# Patient Record
Sex: Male | Born: 1995 | Race: White | Hispanic: No | Marital: Single | Smoking: Current every day smoker
Health system: Southern US, Community
[De-identification: ages and names within clinical notes are randomized; demographics above are authoritative.]

## PROBLEM LIST (undated history)

## (undated) DIAGNOSIS — F1410 Cocaine abuse, uncomplicated: Secondary | ICD-10-CM

## (undated) DIAGNOSIS — F1910 Other psychoactive substance abuse, uncomplicated: Secondary | ICD-10-CM

## (undated) DIAGNOSIS — F1010 Alcohol abuse, uncomplicated: Secondary | ICD-10-CM

## (undated) DIAGNOSIS — Z720 Tobacco use: Secondary | ICD-10-CM

## (undated) DIAGNOSIS — L309 Dermatitis, unspecified: Secondary | ICD-10-CM

---

## 2015-05-12 ENCOUNTER — Observation Stay (HOSPITAL_COMMUNITY)
Admission: EM | Admit: 2015-05-12 | Discharge: 2015-05-13 | Disposition: A | Discharge status: Home / Self Care | Attending: Internal Medicine | Admitting: Internal Medicine

## 2015-05-12 ENCOUNTER — Encounter (HOSPITAL_COMMUNITY): Payer: Self-pay | Admitting: Emergency Medicine

## 2015-05-12 DIAGNOSIS — R569 Unspecified convulsions: Principal | ICD-10-CM | POA: Insufficient documentation

## 2015-05-12 DIAGNOSIS — S020XXA Fracture of vault of skull, initial encounter for closed fracture: Secondary | ICD-10-CM

## 2015-05-12 DIAGNOSIS — N179 Acute kidney failure, unspecified: Secondary | ICD-10-CM | POA: Insufficient documentation

## 2015-05-12 DIAGNOSIS — F1510 Other stimulant abuse, uncomplicated: Secondary | ICD-10-CM | POA: Diagnosis not present

## 2015-05-12 DIAGNOSIS — S0003XA Contusion of scalp, initial encounter: Secondary | ICD-10-CM | POA: Insufficient documentation

## 2015-05-12 DIAGNOSIS — F1210 Cannabis abuse, uncomplicated: Secondary | ICD-10-CM | POA: Insufficient documentation

## 2015-05-12 DIAGNOSIS — E860 Dehydration: Secondary | ICD-10-CM | POA: Insufficient documentation

## 2015-05-12 DIAGNOSIS — T50902A Poisoning by unspecified drugs, medicaments and biological substances, intentional self-harm, initial encounter: Secondary | ICD-10-CM

## 2015-05-12 DIAGNOSIS — F1010 Alcohol abuse, uncomplicated: Secondary | ICD-10-CM | POA: Diagnosis not present

## 2015-05-12 DIAGNOSIS — F17200 Nicotine dependence, unspecified, uncomplicated: Secondary | ICD-10-CM | POA: Diagnosis not present

## 2015-05-12 DIAGNOSIS — R32 Unspecified urinary incontinence: Secondary | ICD-10-CM | POA: Diagnosis not present

## 2015-05-12 DIAGNOSIS — R000 Tachycardia, unspecified: Secondary | ICD-10-CM | POA: Insufficient documentation

## 2015-05-12 DIAGNOSIS — W19XXXA Unspecified fall, initial encounter: Secondary | ICD-10-CM | POA: Diagnosis not present

## 2015-05-12 DIAGNOSIS — Z720 Tobacco use: Secondary | ICD-10-CM | POA: Diagnosis present

## 2015-05-12 DIAGNOSIS — F1910 Other psychoactive substance abuse, uncomplicated: Secondary | ICD-10-CM | POA: Diagnosis present

## 2015-05-12 DIAGNOSIS — F14129 Cocaine abuse with intoxication, unspecified: Secondary | ICD-10-CM | POA: Insufficient documentation

## 2015-05-12 DIAGNOSIS — F1410 Cocaine abuse, uncomplicated: Secondary | ICD-10-CM

## 2015-05-12 HISTORY — DX: Cocaine abuse, uncomplicated: F14.10

## 2015-05-12 HISTORY — DX: Other psychoactive substance abuse, uncomplicated: F19.10

## 2015-05-12 HISTORY — DX: Tobacco use: Z72.0

## 2015-05-12 HISTORY — DX: Dermatitis, unspecified: L30.9

## 2015-05-12 HISTORY — DX: Alcohol abuse, uncomplicated: F10.10

## 2015-05-12 LAB — CBC
HCT: 42.6 % (ref 39.0–52.0)
Hemoglobin: 14.3 g/dL (ref 13.0–17.0)
MCH: 26.7 pg (ref 26.0–34.0)
MCHC: 33.6 g/dL (ref 30.0–36.0)
MCV: 79.6 fL (ref 78.0–100.0)
PLATELETS: 440 10*3/uL — AB (ref 150–400)
RBC: 5.35 MIL/uL (ref 4.22–5.81)
RDW: 14.4 % (ref 11.5–15.5)
WBC: 16.5 10*3/uL — ABNORMAL HIGH (ref 4.0–10.5)

## 2015-05-12 MED ORDER — LORAZEPAM 1 MG PO TABS
2.0000 mg | ORAL_TABLET | Freq: Once | ORAL | Status: AC
  Administered 2015-05-12: 2 mg via ORAL
  Filled 2015-05-12: qty 2

## 2015-05-12 MED ORDER — SODIUM CHLORIDE 0.9 % IV BOLUS (SEPSIS)
1000.0000 mL | Freq: Once | INTRAVENOUS | Status: AC
  Administered 2015-05-12: 1000 mL via INTRAVENOUS

## 2015-05-12 NOTE — ED Notes (Signed)
[facility] reported witnessed (by Technical sales engineerofficer) seizure with fall, 2-3 mins in duration, post-ictal on EMS arrival, urinary incontinence noted, no oral trauma, hematoma to forehead - no other trauma, ST 125, BP stable  CBG 131

## 2015-05-13 ENCOUNTER — Emergency Department (HOSPITAL_COMMUNITY)

## 2015-05-13 ENCOUNTER — Observation Stay (HOSPITAL_BASED_OUTPATIENT_CLINIC_OR_DEPARTMENT_OTHER)
Admit: 2015-05-13 | Discharge: 2015-05-13 | Disposition: A | Discharge status: Home / Self Care | Attending: Internal Medicine | Admitting: Internal Medicine

## 2015-05-13 ENCOUNTER — Encounter (HOSPITAL_COMMUNITY): Payer: Self-pay | Admitting: Internal Medicine

## 2015-05-13 DIAGNOSIS — F1410 Cocaine abuse, uncomplicated: Secondary | ICD-10-CM | POA: Diagnosis not present

## 2015-05-13 DIAGNOSIS — Z720 Tobacco use: Secondary | ICD-10-CM | POA: Diagnosis present

## 2015-05-13 DIAGNOSIS — S020XXA Fracture of vault of skull, initial encounter for closed fracture: Secondary | ICD-10-CM | POA: Diagnosis present

## 2015-05-13 DIAGNOSIS — F1910 Other psychoactive substance abuse, uncomplicated: Secondary | ICD-10-CM | POA: Diagnosis present

## 2015-05-13 DIAGNOSIS — R569 Unspecified convulsions: Secondary | ICD-10-CM

## 2015-05-13 DIAGNOSIS — F1010 Alcohol abuse, uncomplicated: Secondary | ICD-10-CM | POA: Diagnosis present

## 2015-05-13 DIAGNOSIS — N179 Acute kidney failure, unspecified: Secondary | ICD-10-CM | POA: Diagnosis present

## 2015-05-13 LAB — I-STAT CHEM 8, ED
BUN: 13 mg/dL (ref 6–20)
CHLORIDE: 108 mmol/L (ref 101–111)
Calcium, Ion: 0.93 mmol/L — ABNORMAL LOW (ref 1.12–1.23)
Creatinine, Ser: 1.3 mg/dL — ABNORMAL HIGH (ref 0.61–1.24)
GLUCOSE: 124 mg/dL — AB (ref 65–99)
HEMATOCRIT: 48 % (ref 39.0–52.0)
Hemoglobin: 16.3 g/dL (ref 13.0–17.0)
POTASSIUM: 5.1 mmol/L (ref 3.5–5.1)
SODIUM: 137 mmol/L (ref 135–145)
TCO2: 18 mmol/L (ref 0–100)

## 2015-05-13 LAB — BASIC METABOLIC PANEL
ANION GAP: 14 (ref 5–15)
Anion gap: 8 (ref 5–15)
BUN: 10 mg/dL (ref 6–20)
BUN: 6 mg/dL (ref 6–20)
CHLORIDE: 106 mmol/L (ref 101–111)
CHLORIDE: 107 mmol/L (ref 101–111)
CO2: 18 mmol/L — ABNORMAL LOW (ref 22–32)
CO2: 23 mmol/L (ref 22–32)
CREATININE: 1.13 mg/dL (ref 0.61–1.24)
Calcium: 9.4 mg/dL (ref 8.9–10.3)
Calcium: 8.4 mg/dL — ABNORMAL LOW (ref 8.9–10.3)
Creatinine, Ser: 1.42 mg/dL — ABNORMAL HIGH (ref 0.61–1.24)
GFR calc Af Amer: >60 mL/min (ref >60)
GFR calc non Af Amer: >60 mL/min (ref >60)
Glucose, Bld: 120 mg/dL — ABNORMAL HIGH (ref 65–99)
Glucose, Bld: 96 mg/dL (ref 65–99)
POTASSIUM: 3.9 mmol/L (ref 3.5–5.1)
Potassium: 3.2 mmol/L — ABNORMAL LOW (ref 3.5–5.1)
SODIUM: 138 mmol/L (ref 135–145)
Sodium: 138 mmol/L (ref 135–145)

## 2015-05-13 LAB — CBC
HCT: 37 % — ABNORMAL LOW (ref 39.0–52.0)
Hemoglobin: 12.1 g/dL — ABNORMAL LOW (ref 13.0–17.0)
MCH: 26.7 pg (ref 26.0–34.0)
MCHC: 32.7 g/dL (ref 30.0–36.0)
MCV: 81.5 fL (ref 78.0–100.0)
PLATELETS: 374 10*3/uL (ref 150–400)
RBC: 4.54 MIL/uL (ref 4.22–5.81)
RDW: 14.6 % (ref 11.5–15.5)
WBC: 14.9 10*3/uL — ABNORMAL HIGH (ref 4.0–10.5)

## 2015-05-13 LAB — GLUCOSE, CAPILLARY: Glucose-Capillary: 104 mg/dL — ABNORMAL HIGH (ref 65–99)

## 2015-05-13 LAB — RAPID URINE DRUG SCREEN, HOSP PERFORMED
AMPHETAMINES: NOT DETECTED
BENZODIAZEPINES: POSITIVE — AB
Barbiturates: NOT DETECTED
Cocaine: POSITIVE — AB
Opiates: NOT DETECTED
Tetrahydrocannabinol: POSITIVE — AB

## 2015-05-13 LAB — CK: CK TOTAL: 320 U/L (ref 49–397)

## 2015-05-13 LAB — SODIUM, URINE, RANDOM: Sodium, Ur: 166 mmol/L

## 2015-05-13 LAB — CREATININE, URINE, RANDOM: Creatinine, Urine: 131.63 mg/dL

## 2015-05-13 MED ORDER — ENOXAPARIN SODIUM 40 MG/0.4ML ~~LOC~~ SOLN: 40.0000 mg | Freq: Every day | SUBCUTANEOUS | Status: DC

## 2015-05-13 MED ORDER — ONDANSETRON HCL 4 MG PO TABS: 4.0000 mg | ORAL_TABLET | Freq: Four times a day (QID) | ORAL | Status: DC | PRN

## 2015-05-13 MED ORDER — POTASSIUM CHLORIDE 20 MEQ PO PACK
40.0000 meq | PACK | Freq: Once | ORAL | Status: AC
  Administered 2015-05-13: 40 meq via ORAL
  Filled 2015-05-13: qty 2

## 2015-05-13 MED ORDER — ADULT MULTIVITAMIN W/MINERALS CH
1.0000 | ORAL_TABLET | Freq: Every day | ORAL | Status: DC
  Administered 2015-05-13: 1 via ORAL
  Filled 2015-05-13: qty 1

## 2015-05-13 MED ORDER — LORAZEPAM 2 MG/ML IJ SOLN: 0.0000 mg | Freq: Two times a day (BID) | INTRAMUSCULAR | Status: DC

## 2015-05-13 MED ORDER — ONDANSETRON HCL 4 MG/2ML IJ SOLN: 4.0000 mg | Freq: Four times a day (QID) | INTRAMUSCULAR | Status: DC | PRN

## 2015-05-13 MED ORDER — ACETAMINOPHEN 650 MG RE SUPP: 650.0000 mg | Freq: Four times a day (QID) | RECTAL | Status: DC | PRN

## 2015-05-13 MED ORDER — VITAMIN B-1 100 MG PO TABS
100.0000 mg | ORAL_TABLET | Freq: Every day | ORAL | Status: DC
Start: 2015-05-13 — End: 2015-05-13
  Administered 2015-05-13: 100 mg via ORAL
  Filled 2015-05-13: qty 1

## 2015-05-13 MED ORDER — NICOTINE 21 MG/24HR TD PT24
21.0000 mg | MEDICATED_PATCH | Freq: Every day | TRANSDERMAL | Status: DC
Start: 2015-05-13 — End: 2015-05-13
  Administered 2015-05-13: 21 mg via TRANSDERMAL
  Filled 2015-05-13: qty 1

## 2015-05-13 MED ORDER — LORAZEPAM 2 MG/ML IJ SOLN: 0.0000 mg | Freq: Four times a day (QID) | INTRAMUSCULAR | Status: DC

## 2015-05-13 MED ORDER — ACETAMINOPHEN 325 MG PO TABS
650.0000 mg | ORAL_TABLET | Freq: Four times a day (QID) | ORAL | Status: DC | PRN
  Administered 2015-05-13: 650 mg via ORAL
  Filled 2015-05-13: qty 2

## 2015-05-13 MED ORDER — FOLIC ACID 1 MG PO TABS
1.0000 mg | ORAL_TABLET | Freq: Every day | ORAL | Status: DC
Start: 2015-05-13 — End: 2015-05-13
  Administered 2015-05-13: 1 mg via ORAL
  Filled 2015-05-13: qty 1

## 2015-05-13 MED ORDER — SODIUM CHLORIDE 0.9 % IV BOLUS (SEPSIS)
1000.0000 mL | Freq: Once | INTRAVENOUS | Status: AC
  Administered 2015-05-13: 1000 mL via INTRAVENOUS

## 2015-05-13 MED ORDER — THIAMINE HCL 100 MG/ML IJ SOLN
100.0000 mg | Freq: Every day | INTRAMUSCULAR | Status: DC
  Filled 2015-05-13: qty 2

## 2015-05-13 MED ORDER — LORAZEPAM 1 MG PO TABS: 1.0000 mg | ORAL_TABLET | Freq: Four times a day (QID) | ORAL | Status: DC | PRN

## 2015-05-13 MED ORDER — LORAZEPAM 1 MG PO TABS: 2.0000 mg | ORAL_TABLET | ORAL | Status: DC | PRN

## 2015-05-13 MED ORDER — LORAZEPAM 2 MG/ML IJ SOLN
2.0000 mg | Freq: Once | INTRAMUSCULAR | Status: AC
  Administered 2015-05-13: 2 mg via INTRAVENOUS
  Filled 2015-05-13: qty 1

## 2015-05-13 MED ORDER — SODIUM CHLORIDE 0.9 % IV SOLN
INTRAVENOUS | Status: DC
  Administered 2015-05-13: 03:00:00 via INTRAVENOUS

## 2015-05-13 MED ORDER — LORAZEPAM 2 MG/ML IJ SOLN: 1.0000 mg | Freq: Four times a day (QID) | INTRAMUSCULAR | Status: DC | PRN

## 2015-05-13 NOTE — Procedures (Signed)
HPI:  20 y/o presents with seizure  TECHNICAL SUMMARY:  A multichannel referential and bipolar montage EEG using the standard international 10-20 system was performed on the patient described as lethargic.  The dominant background activity consists of 9 hertz activity seen most prominantly over the posterior head region.  The backgound activity is reactive to eye opening and closing procedures.  Low voltage fast (beta) activity is distributed symmetrically and maximally over the anterior head regions.  ACTIVATION:  Stepwise photic stimulation at 4-20 flashes per second was performed and did not elicit any abnormal waveforms.  Hyperventilation was not performed.  EPILEPTIFORM ACTIVITY:  There were no spikes, sharp waves or paroxysmal activity.  SLEEP:  Stage 1 and 2 sleep noted.  CARDIAC:  The EKG lead revealed a regular sinus rhythm.  IMPRESSION:  This is a normal EEG for the patients stated age.  There were no focal, hemispheric or lateralizing features.  No epileptiform activity was recorded.  A normal EEG does not exclude the diagnosis of a seizure disorder and if seizure remains high on the list of differential diagnosis, an ambulatory EEG may be of value.  Clinical correlation is required.

## 2015-05-13 NOTE — ED Notes (Signed)
Dr Niu at bedside 

## 2015-05-13 NOTE — Progress Notes (Signed)
Pt requesting to leave hospital despite MD's recommendation to stay one more day. Pt verbalized understanding of MD's request but still requested to leave. Pt was attempting to remove IV before RN entered room and redressed IV temporarily. Pt stated IV still came out later while patient was bending over to put socks on.

## 2015-05-13 NOTE — H&P (Signed)
History and Physical    Jimmy May ZOX:[id number] DOB: 06-02-1995 DOA: 05/12/2015  Referring MD/NP/PA:   PCP: No PCP Per Patient   Outpatient Specialists: none  [place]  Chief Complaint: seizure and fall  HPI: Jimmy May is a 20 y.o. male with medical history significant of eczema, who presents with seizure and fall.  [facility] Per report, pt swallowed 8 bags of cocaine when police pulled over at about 1:00 PM. Then he had fall in jail at about 8 hours later and injured his right frontal head, then he had a witnessed seizure with body shaking. Th episode lasted for about 2 or 3 minutes. He was postictal on EMS arrival. He was tachycardic and had urinary incontinence. He has a hematoma to right forehead. When I saw pt in ED, he has abdominal discomfort, but no nausea, vomiting, diarrhea or abdominal pain. He does not have chest pain, shortness breath, cough, fever, chills, symptoms of UTI. No unilateral weakness, numbness or tenderness effusions. No vision or hearing loss. He is oriented 3 in ED.  ED Course: pt was found to have WBC 16.5, temperature normal, tachycardia, acute renal injury with creatinine 1.42. CT-head showed  right subcutaneous scalp hematoma over the anterior frontal region with underlying non depressed skull fracture. No acute intracranial hemorrhage or mass effect. Probable inflammatory changes in the paranasal sinuses.  Review of Systems:   General: no fevers, chills, no changes in body weight, has fatigue HEENT: no blurry vision, hearing changes or sore throat Pulm: no dyspnea, coughing, wheezing CV: no chest pain, no palpitations Abd: no nausea, vomiting, abdominal pain, diarrhea, constipation GU: no dysuria, burning on urination, increased urinary frequency, hematuria  Ext: no leg edema Neuro: no unilateral weakness, numbness, or tingling, no vision change or hearing loss. Had seizure. Skin: no  rash. Has small hematoma over right frontal area MSK: No muscle spasm, no deformity, no limitation of range of movement in spin Heme: No easy bruising.  Travel history: No recent long distant travel.  Allergy: No Known Allergies  Past Medical History  Diagnosis Date  . Tobacco abuse   . Alcohol abuse   . Substance abuse   . Eczema   . Cocaine abuse     History reviewed. No pertinent past surgical history.  Social History:  reports that he has been smoking.  He does not have any smokeless tobacco history on file. He reports that he drinks alcohol. He reports that he uses illicit drugs (Marijuana).  Family History:  Family History  Problem Relation Age of Onset  . Eczema Brother      Prior to Admission medications   Not on File    Physical Exam: Filed Vitals:   05/13/15 0250 05/13/15 0300 05/13/15 0330 05/13/15 0359  BP: 147/94 144/88 131/77 136/79  Pulse: 106 96 102 87  Temp:    97.9 F (36.6 C)  TempSrc:    Oral  Resp:  21 17 18   Height:    6' (1.829 m)  Weight:    86.818 kg (191 lb 6.4 oz)  SpO2:  98% 100% 97%   General: Not in acute distress HEENT:       Eyes: PERRL, EOMI, no scleral icterus.       ENT: No discharge from the ears and nose, no pharynx injection, no tonsillar enlargement.        Neck: No JVD, no bruit, no mass felt. Heme: No neck lymph node  enlargement. Cardiac: S1/S2, RRR, No murmurs, No gallops or rubs. Pulm: No rales, wheezing, rhonchi or rubs. Abd: Soft, nondistended, nontender, no rebound pain, no organomegaly, BS present. GU: No hematuria Ext: No pitting leg edema bilaterally. 2+DP/PT pulse bilaterally. Musculoskeletal: No joint deformities, No joint redness or warmth, no limitation of ROM in spin. Skin: No rashes. Has a small hematoma over R frontal area. Neuro: Alert, oriented X3, cranial nerves II-XII grossly intact, moves all extremities normally. Muscle strength 5/5 in all extremities, sensation to light touch intact. Knee reflex 1+  bilaterally. Negative Babinski's sign. Normal finger to nose test. Psych: Patient is not psychotic, no suicidal or hemocidal ideation.  Labs on Admission: I have personally reviewed following labs and imaging studies  CBC:  Recent Labs Lab 05/12/15 2228 05/12/15 2241  WBC  --  16.5*  HGB 16.3 14.3  HCT 48.0 42.6  MCV  --  79.6  PLT  --  440*   Basic Metabolic Panel:  Recent Labs Lab 05/12/15 2228 05/12/15 2241  NA 137 138  K 5.1 3.9  CL 108 106  CO2  --  18*  GLUCOSE 124* 120*  BUN 13 10  CREATININE 1.30* 1.42*  CALCIUM  --  9.4   GFR: Estimated Creatinine Clearance: 91.1 mL/min (by C-G formula based on Cr of 1.42). Liver Function Tests: No results for input(s): AST, ALT, ALKPHOS, BILITOT, PROT, ALBUMIN in the last 168 hours. No results for input(s): LIPASE, AMYLASE in the last 168 hours. No results for input(s): AMMONIA in the last 168 hours. Coagulation Profile: No results for input(s): INR, PROTIME in the last 168 hours. Cardiac Enzymes:  Recent Labs Lab 05/12/15 0115  CKTOTAL 320   BNP (last 3 results) No results for input(s): PROBNP in the last 8760 hours. HbA1C: No results for input(s): HGBA1C in the last 72 hours. CBG: No results for input(s): GLUCAP in the last 168 hours. Lipid Profile: No results for input(s): CHOL, HDL, LDLCALC, TRIG, CHOLHDL, LDLDIRECT in the last 72 hours. Thyroid Function Tests: No results for input(s): TSH, T4TOTAL, FREET4, T3FREE, THYROIDAB in the last 72 hours. Anemia Panel: No results for input(s): VITAMINB12, FOLATE, FERRITIN, TIBC, IRON, RETICCTPCT in the last 72 hours. Urine analysis: No results found for: COLORURINE, APPEARANCEUR, LABSPEC, PHURINE, GLUCOSEU, HGBUR, BILIRUBINUR, KETONESUR, PROTEINUR, UROBILINOGEN, NITRITE, LEUKOCYTESUR Sepsis Labs: (procalcitonin:4,lacticidven:4) )No results found for this or any previous visit (from the past 240 hour(s)).   Radiological Exams on Admission: Ct Head Wo  Contrast  05/13/2015  CLINICAL DATA:  Seizures and fall. Hematoma and swelling to the forehead. EXAM: CT HEAD WITHOUT CONTRAST TECHNIQUE: Contiguous axial images were obtained from the base of the skull through the vertex without intravenous contrast. COMPARISON:  None. FINDINGS: Ventricles and sulci appear symmetrical. No ventricular dilatation. Cavum septum pellucidum. No mass effect or midline shift. No abnormal extra-axial fluid collections. Gray-white matter junctions are distinct. Basal cisterns are not effaced. No evidence of acute intracranial hemorrhage. Subcutaneous soft tissue scalp hematoma over the right anterior frontal region. Tiny non depressed fracture of the underlying right frontal bone. Mucosal thickening in the paranasal sinuses with partial opacification of the left maxillary antrum, likely inflammatory. Mastoid air cells are not opacified. IMPRESSION: Dx right subcutaneous scalp hematoma over the anterior frontal region with underlying non depressed skull fracture. No acute intracranial hemorrhage or mass effect. Probable inflammatory changes in the paranasal sinuses. Electronically Signed   By: Burman Nieves M.D.   On: 05/13/2015 00:27     EKG: Not done  in ED, will get one.   Assessment/Plan Principal Problem:   Seizure (HCC) Active Problems:   Cocaine abuse   Tobacco abuse   Substance abuse   AKI (acute kidney injury) (HCC)   Nondisplaced right frontal skull fracture (HCC)   Alcohol abuse   Seizure (HCC): Likely due to cocaine intoxication and head injury. This is first time for him to have seizure. Currently patient is oriented 3. Did not have new episode of seizure in the emergency room. CT-head showed non depressed skull fracture, but no acute intracranial hemorrhage or mass effect.  -will admit to tele bed for obs -EEG -Seizure precaution -Frequent neuro checks -Prn ativan for seizure -prn tylenol for pain and Zofran for nausea  Polysubstance abuse:  Including tobacco, alcohol, marijuana, cocaine -Did counseling about importance of quitting substance use -Nicotine patch -Did counseling about the importance of quitting drinking -CIWA protocol -check UDS  AKI: Likely due to prerenal secondary to dehydration. - IVF: Normal saline 1 L, followed by 1 25 mL per hour - Check FeNa - Follow up renal function by BMP - Avoid NSAIDs   DVT ppx: SCD Code Status: Full code Family Communication: None at bed side. Disposition Plan:  Anticipate discharge back to previous home environment Consults called:  none Admission status: Obs / tele     Date of Service 05/13/2015    Lorretta Harp [facility] Pager [phone]  If 7PM-7AM, please contact night-coverage [email or web address] Password [facility] 05/13/2015, 4:28 AM

## 2015-05-13 NOTE — Discharge Summary (Signed)
Physician Discharge Summary  Patient ID: Jimmy May MRN: [id number] DOB/AGE: May 23, 1995 20 y.o.  Admit date: 05/12/2015 Discharge date: 05/13/2015  Admission Diagnoses:  Discharge Diagnoses:  Principal Problem:   Seizure [facility](HCC) Active Problems:   Cocaine abuse   Tobacco abuse   Substance abuse   AKI (acute kidney injury) (HCC)   Nondisplaced right frontal skull fracture (HCC)   Alcohol abuse   Discharged Condition: stable  Hospital Course: [facility] According to patient, he swallowed 8 bags of cocaine when police pulled him over.He was reported to have had a fall at the jail about 8 hours later of incarceration and injured his right frontal head, then he had a witnessed seizure with body shaking. Th episode lasted for about 2 or 3 minutes. He was postictal on EMS arrival. He was tachycardic and had urinary incontinence. He had a hematoma to right forehead. On presentation, he had abdominal discomfort, but no nausea, vomiting, diarrhea or abdominal pain. CT-head showed right subcutaneous scalp hematoma over the anterior frontal region with underlying non depressed skull fracture. No acute intracranial hemorrhage or mass effect. Probable inflammatory changes in the paranasal sinuses.  Patient was admitted and managed supportively. Patient was started on PRN benzodiazepines. Patient was also hydrated. Poison control assisted in directing patient's care. The seizure and presenting problems seemed most likely to acute, excessive cocaine ingestion. Patient was counseled to quit illicit drug use. Patient insists on being discharged back home today.  Discharge Medication - See Med. Rec.  Discharge Exam: Blood pressure 121/69, pulse 78, temperature 97.6 F (36.4 C), temperature source Oral, resp. rate 18, height 6' (1.829 m), weight 86.818 kg (191 lb 6.4 oz), SpO2 100 %.  GC - Awake and alert. Not in any obvious disrtess HEENT - No pallor or  jaundice NECK - Supple. No JVD Lungs - Clear CVS - S1S2 Neuro - Non focal. Moves all limbs Abdomen - Soft and non tender Extremities - No leg edema.  Disposition: Final discharge disposition not confirmed   Discharge Instructions    Diet - low sodium heart healthy    Complete by:  As directed      Discharge instructions    Complete by:  As directed   Call PCP or come back to the hospital with any new symptoms     Increase activity slowly    Complete by:  As directed             Medication List    Notice    You have not been prescribed any medications.       SignedBarnetta Chapel: Oralee Rapaport I Geana Walts 05/13/2015, 2:00 PM

## 2015-05-13 NOTE — Progress Notes (Signed)
EEG Completed; Results Pending  

## 2015-05-13 NOTE — ED Provider Notes (Addendum)
CSN: [id number]     Arrival date & time 05/12/15  2151 History   First MD Initiated Contact with Patient 05/12/15 2208     Chief Complaint  Patient presents with  . Seizures     (Consider location/radiation/quality/duration/timing/severity/associated sxs/prior Treatment) Patient is a 20 y.o. male presenting with seizures. The history is provided by the patient.  Seizures Seizure activity on arrival: no   Seizure type:  Grand mal Preceding symptoms: nausea   Initial focality:  None Episode characteristics: abnormal movements and generalized shaking   Postictal symptoms: confusion   Return to baseline: yes   Severity:  Moderate Duration:  2 minutes Timing:  Once Number of seizures this episode:  1 Progression:  Partially resolved Context: not fever   Context comment:  Sqwallowed cocaine baggy to hide from police Recent head injury:  During the event PTA treatment:  None History of seizures: no     History reviewed. No pertinent past medical history. History reviewed. No pertinent past surgical history. No family history on file. Social History  Substance Use Topics  . Smoking status: Current Every Day Smoker  . Smokeless tobacco: None  . Alcohol Use: Yes    Review of Systems  Neurological: Positive for seizures.  All other systems reviewed and are negative.     Allergies  Review of patient's allergies indicates no known allergies.  Home Medications   Prior to Admission medications   Not on File   BP 150/107 mmHg  Pulse 112  Temp(Src) 98.6 F (37 C)  Resp 18  Ht  (1.854 m)  Wt 220 lb (99.791 kg)  BMI 29.03 kg/m2  SpO2 98% Physical Exam  Constitutional: He is oriented to person, place, and time. He appears well-developed and well-nourished. No distress.  HENT:  Head: Normocephalic. Head is with contusion.    Eyes: Conjunctivae are normal. Right eye exhibits normal extraocular motion. Left eye exhibits normal extraocular motion.  Neck: Neck  supple. No tracheal deviation present.  Cardiovascular: Regular rhythm.  Tachycardia present.   Pulmonary/Chest: Effort normal. No respiratory distress.  Abdominal: Soft. He exhibits no distension.  Neurological: He is alert and oriented to person, place, and time.  Skin: Skin is warm and dry.  Psychiatric: His mood appears anxious.    ED Course  Procedures (including critical care time) Labs Review Labs Reviewed  BASIC METABOLIC PANEL - Abnormal; Notable for the following:    CO2 18 (*)    Glucose, Bld 120 (*)    Creatinine, Ser 1.42 (*)    All other components within normal limits  CBC - Abnormal; Notable for the following:    WBC 16.5 (*)    Platelets 440 (*)    All other components within normal limits  CBG MONITORING, ED    Imaging Review Ct Head Wo Contrast  05/13/2015  CLINICAL DATA:  Seizures and fall. Hematoma and swelling to the forehead. EXAM: CT HEAD WITHOUT CONTRAST TECHNIQUE: Contiguous axial images were obtained from the base of the skull through the vertex without intravenous contrast. COMPARISON:  None. FINDINGS: Ventricles and sulci appear symmetrical. No ventricular dilatation. Cavum septum pellucidum. No mass effect or midline shift. No abnormal extra-axial fluid collections. Gray-white matter junctions are distinct. Basal cisterns are not effaced. No evidence of acute intracranial hemorrhage. Subcutaneous soft tissue scalp hematoma over the right anterior frontal region. Tiny non depressed fracture of the underlying right frontal bone. Mucosal thickening in the paranasal sinuses with partial opacification of the left maxillary antrum, likely  inflammatory. Mastoid air cells are not opacified. IMPRESSION: Dx right subcutaneous scalp hematoma over the anterior frontal region with underlying non depressed skull fracture. No acute intracranial hemorrhage or mass effect. Probable inflammatory changes in the paranasal sinuses. Electronically Signed   By: Burman NievesWilliam  Stevens M.D.    On: 05/13/2015 00:27   I have personally reviewed and evaluated these images and lab results as part of my medical decision-making.   EKG Interpretation None      MDM   Final diagnoses:  Cocaine abuse  Purposeful non-suicidal drug ingestion  Seizure (HCC)  Nondisplaced right frontal skull fracture, closed, initial encounter [facility](HCC)    20 y.o. male presents with First time seizure episode in his jail cell this evening. He fell forward and sustained a contusion to his right forehead. It is unclear whether the seizure started while the patient was standing or after the head injury as he does not remember and was postictal following the event. He is tachycardic and has some mydriasis on arrival. When questioned about ingestions or drug use he denies initially.  After an hour in the emergency department the patient admits to eating a small baggie of cocaine of an unknown amount when pulled over this afternoon. His seizure occurred 6-8 hours after the event and he feels like his legs are shaking. He is mildly diaphoretic and appears consistent with a sympathomimetic toxidrome. I'm concerned that we're unsure of the amount of cocaine ingestion, if it is in a baggie how quickly it will be released into his bloodstream and would recommend the patient be monitored overnight and consider a bowel regimen to pass it.    Lyndal Pulleyaniel Cniyah Sproull, MD 05/13/15 [id number]  Lyndal Pulleyaniel Shakeena Kafer, MD 05/13/15 [phone]

## 2015-05-13 NOTE — ED Notes (Signed)
Pt given sandwich after passing swallow screen per Dr.Niu

## 2015-05-13 NOTE — Care Management Note (Signed)
Case Management Note  Patient Details  Name: [place] XXXDaye MRN: [id number] Date of Birth: [birth year]  Subjective/Objective:                    Action/Plan: Patient discharging home with self care. No further needs per CM.   Expected Discharge Date:                  Expected Discharge Plan:  Home/Self Care  In-House Referral:     Discharge planning Services     Post Acute Care Choice:    Choice offered to:     DME Arranged:    DME Agency:     HH Arranged:    HH Agency:     Status of Service:  Completed, signed off  Medicare Important Message Given:    Date Medicare IM Given:    Medicare IM give by:    Date Additional Medicare IM Given:    Additional Medicare Important Message give by:     If discussed at Long Length of Stay Meetings, dates discussed:    Additional Comments:  Kermit BaloKelli F Christan Ciccarelli, RN 05/13/2015, 2:33 PM

## 2015-05-13 NOTE — Progress Notes (Signed)
Pt discharged from hospital per orders from MD. MD encouraged patient to remain in the hospital for one more day, but pt insisted on going through with discharge today. Pt and family were educated on discharge instructions. Pt verbalized understanding of instructions. Pt's IV was removed before discharge. Pt was able to ambulate while exiting hospital.

## 2017-04-22 IMAGING — CT CT HEAD W/O CM
2 series · 16 of 30 positions shown, 18 images · non-contrast
Comparison: None.

CLINICAL DATA: Seizures and fall. Hematoma and swelling to the
forehead.

EXAM:
CT HEAD WITHOUT CONTRAST
TECHNIQUE: Contiguous axial images were obtained from the base of the skull
through the vertex without intravenous contrast.

[Series 201: head w/o, idose (1) · axial · non-contrast · 0.51mm/px · z∈[+1137,+1272]mm · 8 of 35 slices shown, 10 images]
[im 4/35  brain]
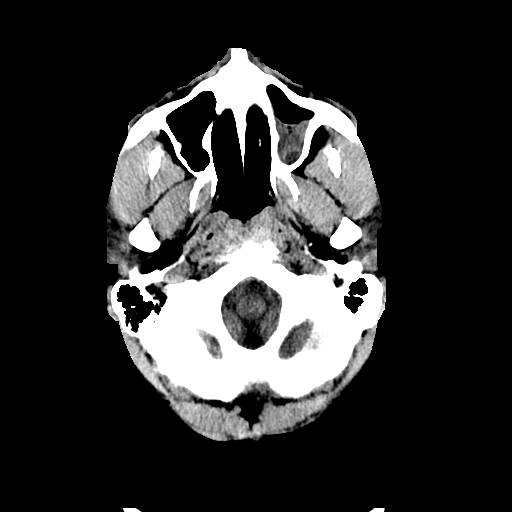
[im 4/35  bone]
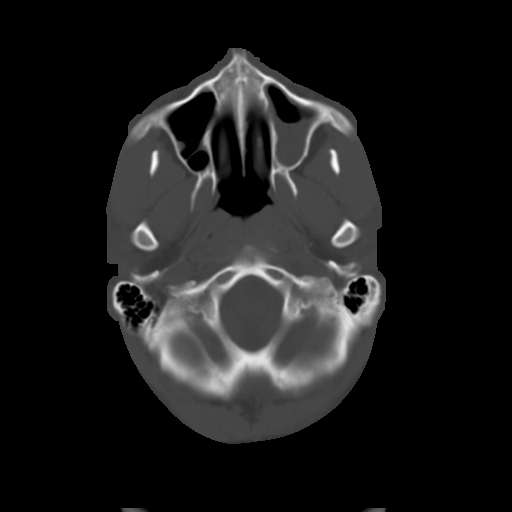
[im 8/35  brain]
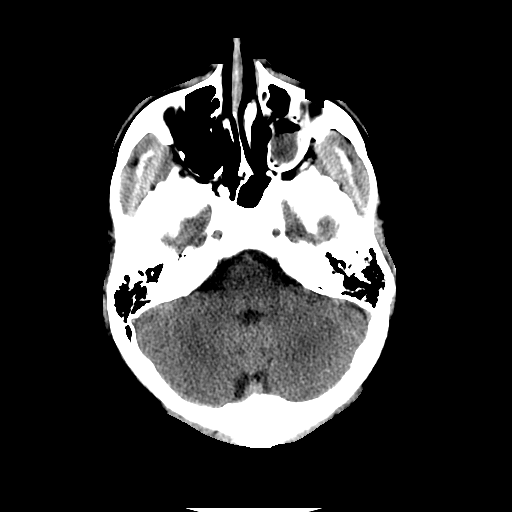
[im 12/35  brain]
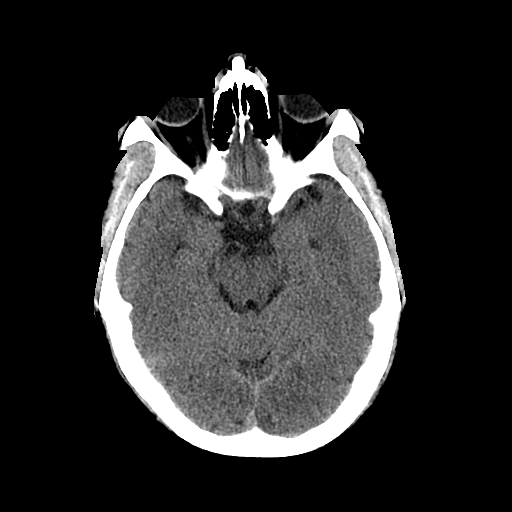
[im 16/35  brain]
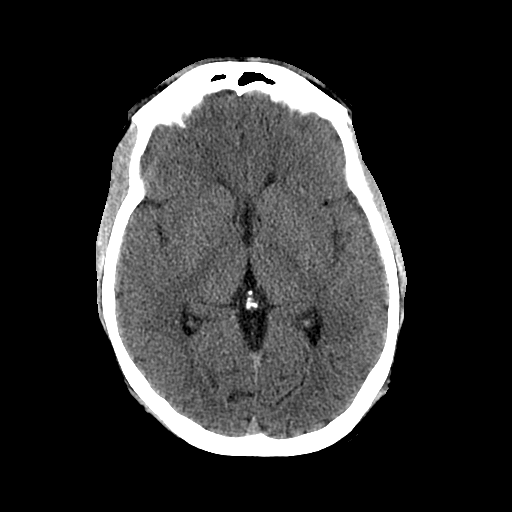
[im 19/35  brain]
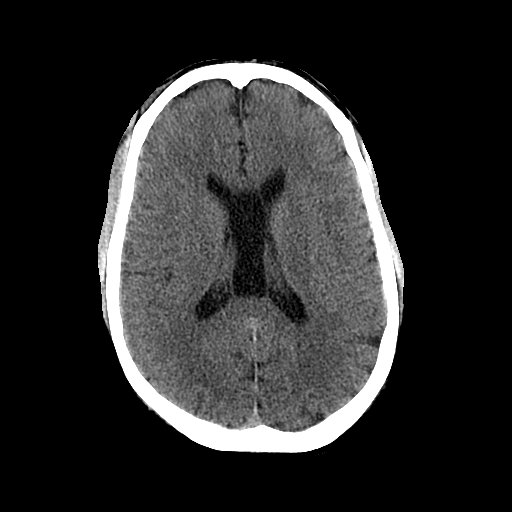
[im 19/35  bone]
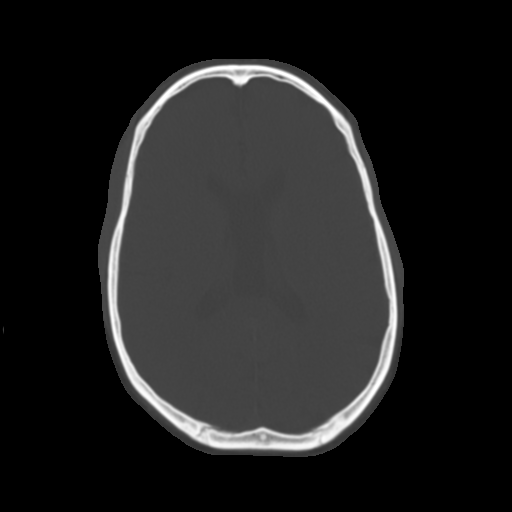
[im 23/35  brain]
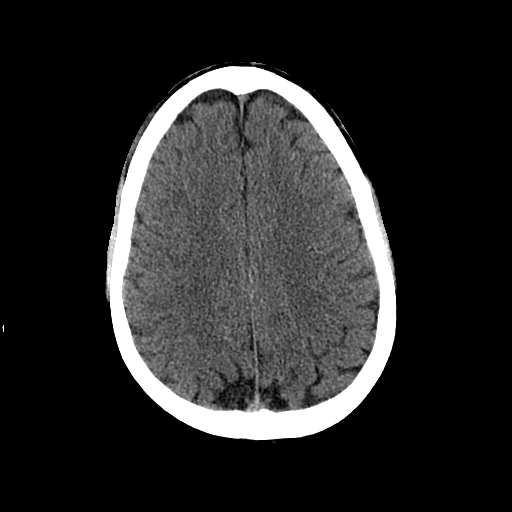
[im 27/35  brain]
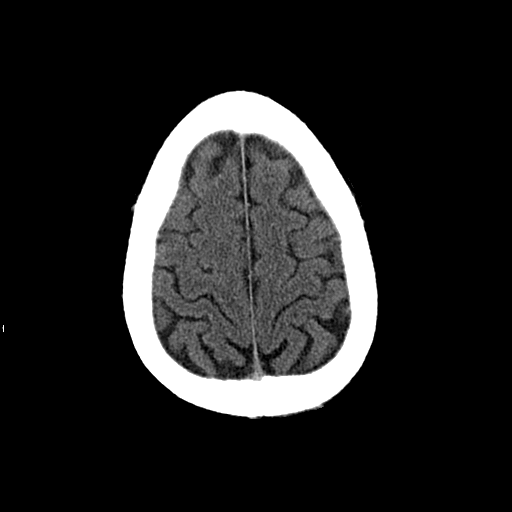
[im 31/35  brain]
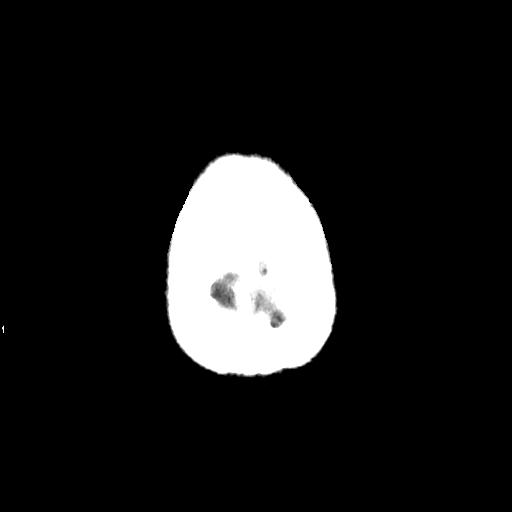

[Series 202: head w/o bone, idose (1) · axial · non-contrast · 0.51mm/px · z∈[+1138,+1273]mm · 8 of 70 slices shown]
[im 8/70  bone]
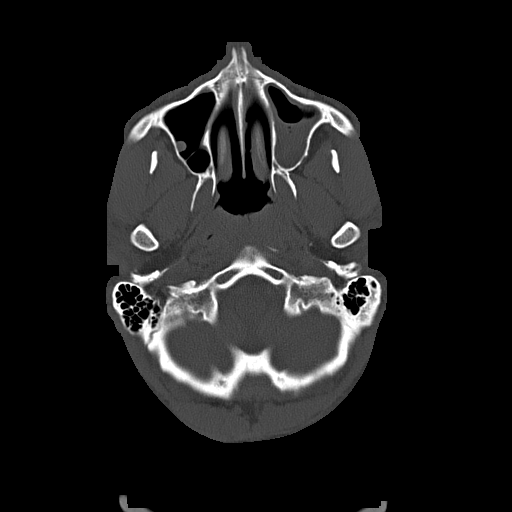
[im 15/70  bone]
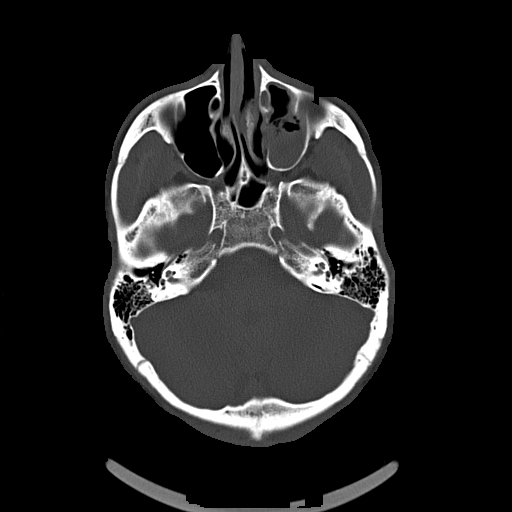
[im 22/70  bone]
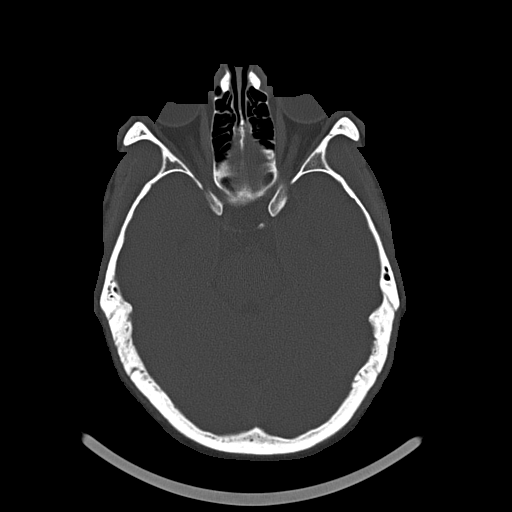
[im 30/70  bone]
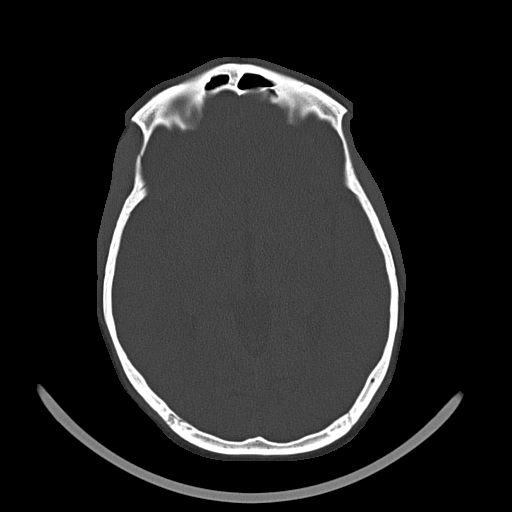
[im 40/70  bone]
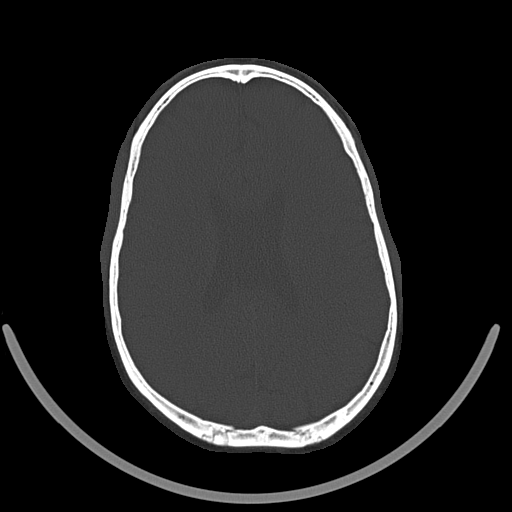
[im 48/70  bone]
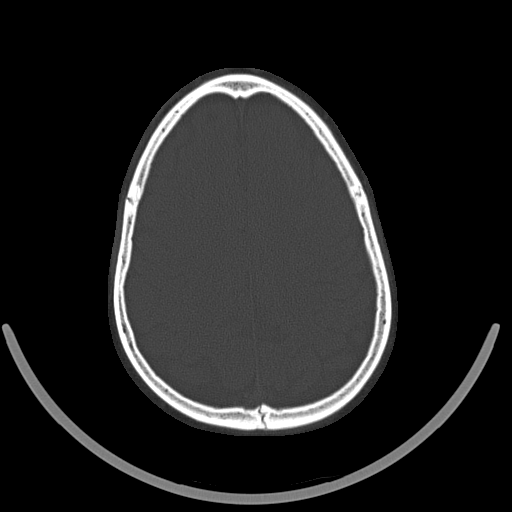
[im 55/70  bone]
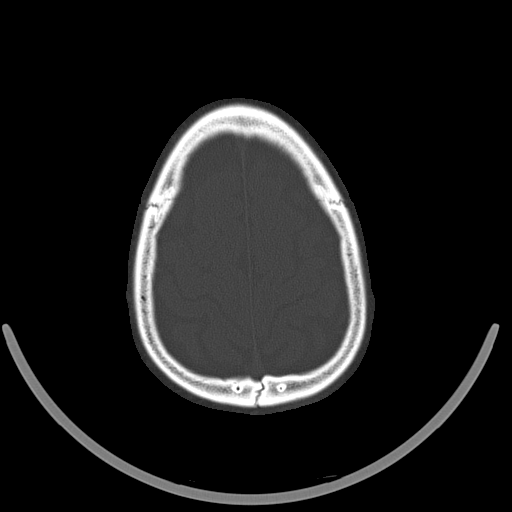
[im 62/70  bone]
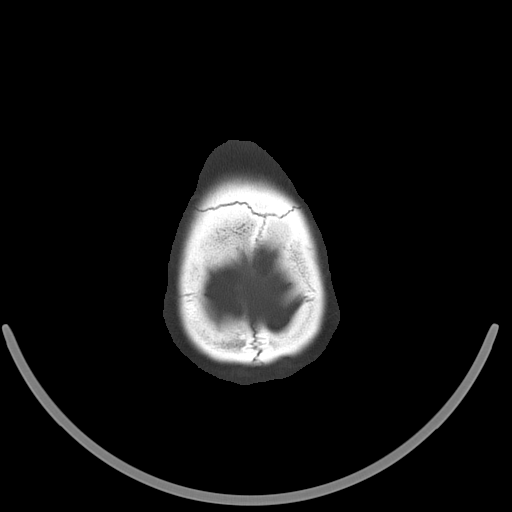

[16 of 30 positions shown; findings below may reference images not displayed]

FINDINGS: Ventricles and sulci appear symmetrical. No ventricular dilatation.
Cavum septum pellucidum. No mass effect or midline shift. No
abnormal extra-axial fluid collections. Gray-white matter junctions
are distinct. Basal cisterns are not effaced. No evidence of acute
intracranial hemorrhage. Subcutaneous soft tissue scalp hematoma
over the right anterior frontal region. Tiny non depressed fracture
of the underlying right frontal bone. Mucosal thickening in the
paranasal sinuses with partial opacification of the left maxillary
antrum, likely inflammatory. Mastoid air cells are not opacified.
IMPRESSION: Dx right subcutaneous scalp hematoma over the anterior frontal
region with underlying non depressed skull fracture. No acute
intracranial hemorrhage or mass effect. Probable inflammatory
changes in the paranasal sinuses.
